# Patient Record
Sex: Male | Born: 1980 | Race: White | Hispanic: No | Marital: Single | Smoking: Current every day smoker
Health system: Southern US, Community
[De-identification: ages and names within clinical notes are randomized; demographics above are authoritative.]

## PROBLEM LIST (undated history)

## (undated) DIAGNOSIS — M549 Dorsalgia, unspecified: Secondary | ICD-10-CM

## (undated) DIAGNOSIS — G8929 Other chronic pain: Secondary | ICD-10-CM

## (undated) HISTORY — PX: OTHER SURGICAL HISTORY: SHX169

---

## 2008-08-03 ENCOUNTER — Emergency Department (HOSPITAL_COMMUNITY): Admission: EM | Admit: 2008-08-03 | Discharge: 2008-08-04 | Payer: Self-pay | Admitting: Emergency Medicine

## 2011-03-25 ENCOUNTER — Encounter: Payer: Self-pay | Admitting: *Deleted

## 2011-03-25 ENCOUNTER — Emergency Department (HOSPITAL_BASED_OUTPATIENT_CLINIC_OR_DEPARTMENT_OTHER)
Admission: EM | Admit: 2011-03-25 | Discharge: 2011-03-25 | Disposition: A | Discharge status: Home / Self Care | Payer: Self-pay | Attending: Emergency Medicine | Admitting: Emergency Medicine

## 2011-03-25 DIAGNOSIS — M549 Dorsalgia, unspecified: Secondary | ICD-10-CM | POA: Insufficient documentation

## 2011-03-25 DIAGNOSIS — F17200 Nicotine dependence, unspecified, uncomplicated: Secondary | ICD-10-CM | POA: Insufficient documentation

## 2011-03-25 HISTORY — DX: Other chronic pain: G89.29

## 2011-03-25 MED ORDER — TRAMADOL HCL 50 MG PO TABS: 50.0000 mg | ORAL_TABLET | Freq: Four times a day (QID) | ORAL | Status: AC | PRN
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
## 2011-03-25 NOTE — ED Provider Notes (Signed)
History/physical exam/procedure(s) were performed by non-physician practitioner and as supervising physician I was immediately available for consultation/collaboration. I have reviewed all notes and am in agreement with care and plan.  Hilario Quarry, MD 03/25/11 [phone]

## 2011-03-25 NOTE — ED Notes (Signed)
Back pain entire back. Chronic pain since July. Was taken off Percocet and Vicodin and given Rx Gabepentin. States he needs Percocet.
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
## 2012-01-21 ENCOUNTER — Other Ambulatory Visit: Payer: Self-pay | Admitting: Neurosurgery

## 2012-01-21 DIAGNOSIS — IMO0002 Reserved for concepts with insufficient information to code with codable children: Secondary | ICD-10-CM

## 2012-01-21 DIAGNOSIS — M545 Low back pain: Secondary | ICD-10-CM

## 2012-01-23 ENCOUNTER — Other Ambulatory Visit: Payer: Self-pay

## 2012-01-23 ENCOUNTER — Inpatient Hospital Stay
Admission: RE | Admit: 2012-01-23 | Discharge: 2012-01-23 | Payer: Self-pay | Source: Ambulatory Visit | Attending: Neurosurgery | Admitting: Neurosurgery

## 2012-01-26 ENCOUNTER — Other Ambulatory Visit: Payer: Self-pay | Admitting: Neurosurgery

## 2012-01-26 DIAGNOSIS — M545 Low back pain: Secondary | ICD-10-CM

## 2012-01-26 DIAGNOSIS — IMO0002 Reserved for concepts with insufficient information to code with codable children: Secondary | ICD-10-CM

## 2012-01-28 ENCOUNTER — Ambulatory Visit
Admission: RE | Admit: 2012-01-28 | Discharge: 2012-01-28 | Disposition: A | Discharge status: Home / Self Care | Payer: Self-pay | Source: Ambulatory Visit | Attending: Neurosurgery | Admitting: Neurosurgery

## 2012-01-28 VITALS — BP 111/62 | HR 78

## 2012-01-28 DIAGNOSIS — M545 Low back pain: Secondary | ICD-10-CM

## 2012-01-28 DIAGNOSIS — IMO0002 Reserved for concepts with insufficient information to code with codable children: Secondary | ICD-10-CM
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
## 2012-01-28 MED ORDER — IOHEXOL 180 MG/ML  SOLN
17.0000 mL | Freq: Once | INTRAMUSCULAR | Status: AC | PRN
  Administered 2012-01-28: 17 mL via INTRATHECAL

## 2012-01-28 MED ORDER — DIAZEPAM 5 MG PO TABS
10.0000 mg | ORAL_TABLET | Freq: Once | ORAL | Status: AC
  Administered 2012-01-28: 10 mg via ORAL

## 2012-01-28 NOTE — Progress Notes (Signed)
Resting quietly on stomach; states he tends to sleep like this at home.  Says he feels "a little weird," perhaps from the Valium, being off his Amitriptylline and the sensations the procedure produces.  Snack at bedside, though patient with eyes closed at the moment.  jkl

## 2012-01-28 NOTE — Progress Notes (Signed)
Pt states he has been off amitriptyline for the past 2 days.

## 2012-01-29 ENCOUNTER — Telehealth: Payer: Self-pay | Admitting: Radiology

## 2012-01-29 NOTE — Telephone Encounter (Signed)
Pt c/o post myelo headache, pt instructed to go back to bed, force fluids and call early tomorrow if headache persist.

## 2012-01-30 ENCOUNTER — Telehealth: Payer: Self-pay

## 2012-01-30 NOTE — Telephone Encounter (Signed)
Pt asked about blood patch but does not want at this time and will maintain bedrest and call Monday if no improvement. Will call Dr. Despina Arias office [id number] patch order in case we need it. D. DoughertyRN

## 2012-02-02 ENCOUNTER — Ambulatory Visit
Admission: RE | Admit: 2012-02-02 | Discharge: 2012-02-02 | Disposition: A | Discharge status: Home / Self Care | Payer: Self-pay | Source: Ambulatory Visit | Attending: Neurosurgery | Admitting: Neurosurgery

## 2012-02-02 ENCOUNTER — Other Ambulatory Visit: Payer: Self-pay | Admitting: Neurosurgery

## 2012-02-02 DIAGNOSIS — G971 Other reaction to spinal and lumbar puncture: Secondary | ICD-10-CM

## 2012-02-02 MED ORDER — IOHEXOL 180 MG/ML  SOLN
1.0000 mL | Freq: Once | INTRAMUSCULAR | Status: AC | PRN
  Administered 2012-02-02: 1 mL via EPIDURAL

## 2012-02-02 NOTE — Progress Notes (Signed)
1125 blood drawn for blood patch, 15 cc of blood drawn from left antecubital vein. Site unremarkable and pt tolerated fairly well, very nervous about any kind of needle stick.

## 2013-07-07 ENCOUNTER — Emergency Department (HOSPITAL_BASED_OUTPATIENT_CLINIC_OR_DEPARTMENT_OTHER)
Admission: EM | Admit: 2013-07-07 | Discharge: 2013-07-07 | Discharge status: Against Medical Advice | Payer: Medicaid Other | Attending: Emergency Medicine | Admitting: Emergency Medicine

## 2013-07-07 ENCOUNTER — Encounter (HOSPITAL_BASED_OUTPATIENT_CLINIC_OR_DEPARTMENT_OTHER): Payer: Self-pay | Admitting: Emergency Medicine

## 2013-07-07 DIAGNOSIS — F17200 Nicotine dependence, unspecified, uncomplicated: Secondary | ICD-10-CM | POA: Insufficient documentation

## 2013-07-07 DIAGNOSIS — R110 Nausea: Secondary | ICD-10-CM | POA: Insufficient documentation

## 2013-07-07 DIAGNOSIS — R269 Unspecified abnormalities of gait and mobility: Secondary | ICD-10-CM | POA: Insufficient documentation

## 2013-07-07 DIAGNOSIS — R42 Dizziness and giddiness: Secondary | ICD-10-CM | POA: Insufficient documentation

## 2013-07-07 DIAGNOSIS — R319 Hematuria, unspecified: Secondary | ICD-10-CM | POA: Insufficient documentation

## 2013-07-07 DIAGNOSIS — G8929 Other chronic pain: Secondary | ICD-10-CM | POA: Insufficient documentation

## 2013-07-07 NOTE — ED Notes (Addendum)
Hematuria since this am. Nausea and lightheaded. His gait is staggered and his speech is slurred. He states he took his normal pain medication that he normally takes.

## 2013-07-07 NOTE — ED Notes (Signed)
John EMT got pt a w/c due to unsteady gait. Pt refused to sit down. States he is leaving. Girl friend is in the waiting area and will drive him home.

## 2014-05-09 ENCOUNTER — Emergency Department (HOSPITAL_COMMUNITY)
Admission: EM | Admit: 2014-05-09 | Discharge: 2014-05-09 | Disposition: A | Discharge status: Home / Self Care | Payer: Medicaid Other | Attending: Emergency Medicine | Admitting: Emergency Medicine

## 2014-05-09 ENCOUNTER — Encounter (HOSPITAL_COMMUNITY): Payer: Self-pay | Admitting: Emergency Medicine

## 2014-05-09 DIAGNOSIS — Z79891 Long term (current) use of opiate analgesic: Secondary | ICD-10-CM | POA: Diagnosis not present

## 2014-05-09 DIAGNOSIS — S40869A Insect bite (nonvenomous) of unspecified upper arm, initial encounter: Secondary | ICD-10-CM | POA: Insufficient documentation

## 2014-05-09 DIAGNOSIS — Z720 Tobacco use: Secondary | ICD-10-CM | POA: Insufficient documentation

## 2014-05-09 DIAGNOSIS — Y9259 Other trade areas as the place of occurrence of the external cause: Secondary | ICD-10-CM | POA: Diagnosis not present

## 2014-05-09 DIAGNOSIS — Y998 Other external cause status: Secondary | ICD-10-CM | POA: Diagnosis not present

## 2014-05-09 DIAGNOSIS — S1086XA Insect bite of other specified part of neck, initial encounter: Secondary | ICD-10-CM | POA: Insufficient documentation

## 2014-05-09 DIAGNOSIS — G8929 Other chronic pain: Secondary | ICD-10-CM | POA: Diagnosis not present

## 2014-05-09 DIAGNOSIS — S60569A Insect bite (nonvenomous) of unspecified hand, initial encounter: Secondary | ICD-10-CM | POA: Diagnosis not present

## 2014-05-09 DIAGNOSIS — W57XXXA Bitten or stung by nonvenomous insect and other nonvenomous arthropods, initial encounter: Secondary | ICD-10-CM | POA: Diagnosis not present

## 2014-05-09 DIAGNOSIS — R21 Rash and other nonspecific skin eruption: Secondary | ICD-10-CM

## 2014-05-09 DIAGNOSIS — Z79899 Other long term (current) drug therapy: Secondary | ICD-10-CM | POA: Insufficient documentation

## 2014-05-09 DIAGNOSIS — Y9389 Activity, other specified: Secondary | ICD-10-CM | POA: Insufficient documentation

## 2014-05-09 DIAGNOSIS — S80869A Insect bite (nonvenomous), unspecified lower leg, initial encounter: Secondary | ICD-10-CM | POA: Diagnosis not present

## 2014-05-09 DIAGNOSIS — Z791 Long term (current) use of non-steroidal anti-inflammatories (NSAID): Secondary | ICD-10-CM | POA: Diagnosis not present

## 2014-05-09 DIAGNOSIS — S70369A Insect bite (nonvenomous), unspecified thigh, initial encounter: Secondary | ICD-10-CM | POA: Diagnosis not present

## 2014-05-09 DIAGNOSIS — S0086XA Insect bite (nonvenomous) of other part of head, initial encounter: Secondary | ICD-10-CM | POA: Diagnosis not present

## 2014-05-09 MED ORDER — CEPHALEXIN 250 MG PO CAPS: 250.0000 mg | ORAL_CAPSULE | Freq: Four times a day (QID) | ORAL | Status: DC

## 2014-05-09 MED ORDER — CEPHALEXIN 250 MG/5ML PO SUSR: 250.0000 mg | Freq: Four times a day (QID) | ORAL | Status: DC

## 2014-05-09 MED ORDER — HYDROXYZINE HCL 25 MG PO TABS: 25.0000 mg | ORAL_TABLET | Freq: Four times a day (QID) | ORAL | Status: DC | PRN

## 2014-05-09 MED ORDER — PERMETHRIN 5 % EX CREA: TOPICAL_CREAM | CUTANEOUS | Status: DC

## 2014-05-09 MED ORDER — PERMETHRIN 5 % EX CREA: TOPICAL_CREAM | CUTANEOUS | Status: AC

## 2014-05-09 NOTE — Discharge Instructions (Signed)

## 2014-05-09 NOTE — ED Notes (Signed)
Pt. reports multiple itchy rashes at arms ,legs and back of neck with drainage for several weeks , denies fever or chills.

## 2014-05-09 NOTE — ED Provider Notes (Signed)
CSN: [id number]     Arrival date & time 05/09/14  0419 History   First MD Initiated Contact with Patient 05/09/14 [phone]     Chief Complaint  Patient presents with  . Rash     (Consider location/radiation/quality/duration/timing/severity/associated sxs/prior Treatment) HPI Patient presents with itching rash for the past month. He states a lot of of hotel rooms is concern for possible bedbug bites. He also admits to scratching the lesions. He does develop erythema and discharge from several of the site to his scratching. He denies any fever or chills.  Past Medical History  Diagnosis Date  . Chronic pain    History reviewed. No pertinent past surgical history. No family history on file. History  Substance Use Topics  . Smoking status: Current Every Day Smoker -- 2.00 packs/day  . Smokeless tobacco: Not on file  . Alcohol Use: No    Review of Systems  Constitutional: Negative for fever and chills.  Skin: Positive for color change and rash.  All other systems reviewed and are negative.     Allergies  Gabapentin  Home Medications   Prior to Admission medications   Medication Sig Start Date End Date Taking? Authorizing Provider  Methocarbamol (ROBAXIN PO) Take 1 tablet by mouth 2 (two) times daily as needed (pain).    Yes Historical Provider, MD  naproxen sodium (ANAPROX) 220 MG tablet Take 220 mg by mouth 2 (two) times daily as needed (pain).   Yes Historical Provider, MD  oxyCODONE (ROXICODONE) 15 MG immediate release tablet Take 15 mg by mouth 4 (four) times daily.   Yes Historical Provider, MD  cephALEXin (KEFLEX) 250 MG capsule Take 1 capsule (250 mg total) by mouth 4 (four) times daily. 05/09/14   Loren Raceravid Domenique Southers, MD  GABAPENTIN PO Take by mouth.      Historical Provider, MD  hydrOXYzine (ATARAX/VISTARIL) 25 MG tablet Take 1 tablet (25 mg total) by mouth every 6 (six) hours as needed for itching. 05/09/14   Loren Raceravid Asencion Loveday, MD  Oxycodone-Acetaminophen (PERCOCET PO) Take by  mouth.    Historical Provider, MD  permethrin (ELIMITE) 5 % cream Apply to entire body once 05/09/14   Loren Raceravid Arella Blinder, MD   BP 121/92 mmHg  Pulse 86  Temp(Src) 97.4 F (36.3 C) (Oral)  Resp 19  Ht 5\' 9"  (1.753 m)  Wt 145 lb (65.772 kg)  BMI 21.40 kg/m2  SpO2 99% Physical Exam  Constitutional: He is oriented to person, place, and time. He appears well-developed and well-nourished. No distress.  HENT:  Head: Normocephalic and atraumatic.  Mouth/Throat: Oropharynx is clear and moist.  Eyes: EOM are normal. Pupils are equal, round, and reactive to light.  Neck: Normal range of motion. Neck supple.  Cardiovascular: Normal rate and regular rhythm.   Pulmonary/Chest: Effort normal and breath sounds normal. No respiratory distress. He has no wheezes. He has no rales.  Abdominal: Soft. Bowel sounds are normal. He exhibits no distension and no mass. There is no tenderness. There is no rebound and no guarding.  Musculoskeletal: Normal range of motion. He exhibits no edema or tenderness.  Neurological: He is alert and oriented to person, place, and time.  Skin: Skin is warm and dry. Rash (patient with multiple lesions of different stages of healing. These are mostly on the extremities and posterior head and neck. On the posterior neck these appear to be any linear arrangement. There is surrounding erythema and swelling. No definite fluctuan) noted. There is erythema.  Psychiatric: He has a normal mood  and affect. His behavior is normal.  Nursing note and vitals reviewed.   ED Course  Procedures (including critical care time) Labs Review Labs Reviewed - No data to display  Imaging Review No results found.   EKG Interpretation None      MDM   Final diagnoses:  Rash    Patient with multiple erythematous papules with possible secondary infection. Given the linear fashion of the lesions on the back of the neck suspect possible scabies. There is no inter-digital lesions. No definite  abscess. We'll treat with Elimite and Keflex. Return precautions have been given.    Loren Raceravid Ciaran Begay, MD 05/09/14 [phone]

## 2014-05-09 NOTE — ED Notes (Signed)
Dr. Yelverton at the bedside.  

## 2014-06-16 ENCOUNTER — Emergency Department (HOSPITAL_COMMUNITY)
Admission: EM | Admit: 2014-06-16 | Discharge: 2014-06-16 | Disposition: A | Discharge status: Home / Self Care | Payer: Medicaid Other | Attending: Emergency Medicine | Admitting: Emergency Medicine

## 2014-06-16 ENCOUNTER — Encounter (HOSPITAL_COMMUNITY): Payer: Self-pay | Admitting: *Deleted

## 2014-06-16 DIAGNOSIS — G8929 Other chronic pain: Secondary | ICD-10-CM | POA: Insufficient documentation

## 2014-06-16 DIAGNOSIS — Z79899 Other long term (current) drug therapy: Secondary | ICD-10-CM | POA: Insufficient documentation

## 2014-06-16 DIAGNOSIS — L989 Disorder of the skin and subcutaneous tissue, unspecified: Secondary | ICD-10-CM | POA: Diagnosis not present

## 2014-06-16 DIAGNOSIS — R21 Rash and other nonspecific skin eruption: Secondary | ICD-10-CM

## 2014-06-16 DIAGNOSIS — Z720 Tobacco use: Secondary | ICD-10-CM | POA: Insufficient documentation

## 2014-06-16 DIAGNOSIS — Z791 Long term (current) use of non-steroidal anti-inflammatories (NSAID): Secondary | ICD-10-CM | POA: Diagnosis not present

## 2014-06-16 HISTORY — DX: Dorsalgia, unspecified: M54.9

## 2014-06-16 MED ORDER — CEPHALEXIN 250 MG PO CAPS: 250.0000 mg | ORAL_CAPSULE | Freq: Four times a day (QID) | ORAL | Status: DC

## 2014-06-16 MED ORDER — HYDROXYZINE HCL 25 MG PO TABS: 25.0000 mg | ORAL_TABLET | Freq: Four times a day (QID) | ORAL | Status: DC | PRN

## 2014-06-16 MED ORDER — CEPHALEXIN 250 MG PO CAPS: 250.0000 mg | ORAL_CAPSULE | Freq: Four times a day (QID) | ORAL | Status: AC

## 2014-06-16 MED ORDER — HYDROXYZINE HCL 25 MG PO TABS: 25.0000 mg | ORAL_TABLET | Freq: Four times a day (QID) | ORAL | Status: AC | PRN

## 2014-06-16 NOTE — ED Notes (Signed)
Patient presents with rash on legs, face, neck  Has been treated several times for scabies but they continue to return

## 2014-06-16 NOTE — Discharge Instructions (Signed)
Bedbugs  Bedbugs are tiny bugs that live in and around beds. During the day, they hide in mattresses and other places near beds. They come out at night and bite people lying in bed. They need blood to live and grow. Bedbugs can be found in beds anywhere. Usually, they are found in places where many people come and go (hotels, shelters, hospitals). It does not matter whether the place is dirty or clean.  Getting bitten by bedbugs rarely causes a medical problem. The biggest problem can be getting rid of them.  This often takes the work of a pest control expert.  CAUSES  · Less use of pesticides. Bedbugs were common before the 1950s. Then, strong pesticides such as DDT nearly wiped them out. Today, these pesticides are not used because they harm the environment and can cause health problems.  · More travel. Besides mattresses, bedbugs can also live in clothing and luggage. They can come along as people travel from place to place. Bedbugs are more common in certain parts of the world. When people travel to those areas, the bugs can come home with them.  · Presence of birds and bats. Bedbugs often infest birds and bats. If you have these animals in or near your home, bedbugs may infest your house, too.  SYMPTOMS  It does not hurt to be bitten by a bedbug. You will probably not wake up when you are bitten. Bedbugs usually bite areas of the skin that are not covered. Symptoms may show when you wake up, or they may take a day or more to show up. Symptoms may include:  · Small red bumps on the skin. These might be lined up in a row or clustered in a group.  · A darker red dot in the middle of red bumps.  · Blisters on the skin. There may be swelling and very bad itching. These may be signs of an allergic reaction. This does not happen often.  DIAGNOSIS  Bedbug bites might look and feel like other types of insect bites. The bugs do not stay on the body like ticks or lice. They bite, drop off, and crawl away to hide. Your  caregiver will probably:  · Ask about your symptoms.  · Ask about your recent activities and travel.  · Check your skin for bedbug bites.  · Ask you to check at home for signs of bedbugs. You should look for:  ¨ Spots or stains on the bed or nearby. This could be from bedbugs that were crushed or from their eggs or waste.  ¨ Bedbugs themselves. They are reddish-brown, oval, and flat. They do not fly. They are about the size of an apple seed.  · Places to look for bedbugs include:  ¨ Beds. Check mattresses, headboards, box springs, and bed frames.  ¨ On drapes and curtains near the bed.  ¨ Under carpeting in the bedroom.  ¨ Behind electrical outlets.  ¨ Behind any wallpaper that is peeling.  ¨ Inside luggage.  TREATMENT  Most bedbug bites do not need treatment. They usually go away on their own in a few days. The bites are not dangerous. However, treatment may be needed if you have scratched so much that your skin has become infected. You may also need treatment if you are allergic to bedbug bites. Treatment options include:  · A drug that stops swelling and itching (corticosteroid). Usually, a cream is rubbed on the skin. If you have a bad rash, you may be   given a corticosteroid pill.  · Oral antihistamines. These are pills to help control itching.  · Antibiotic medicines. An antibiotic may be prescribed for infected skin.  HOME CARE INSTRUCTIONS   · Take any medicine prescribed by your caregiver for your bites. Follow the directions carefully.  · Consider wearing pajamas with long sleeves and pant legs.  · Your bedroom may need to be treated. A pest control expert should make sure the bedbugs are gone. You may need to throw away mattresses or luggage. Ask the pest control expert what you can do to keep the bedbugs from coming back. Common suggestions include:  ¨ Putting a plastic cover over your mattress.  ¨ Washing and drying your clothes and bedding in hot water and a hot dryer. The temperature should be hotter  than 120° F (48.9° C). Bedbugs are killed by high temperatures.  ¨ Vacuuming carefully all around your bed. Vacuum in all cracks and crevices where the bugs might hide. Do this often.  ¨ Carefully checking all used furniture, bedding, or clothes that you bring into your house.  ¨ Eliminating bird nests and bat roosts.  · If you get bedbug bites when traveling, check all your possessions carefully before bringing them into your house. If you find any bugs on clothes or in your luggage, consider throwing those items away.  SEEK MEDICAL CARE IF:  · You have red bug bites that keep coming back.  · You have red bug bites that itch badly.  · You have bug bites that cause a skin rash.  · You have scratch marks that are red and sore.  SEEK IMMEDIATE MEDICAL CARE IF:  You have a fever.  Document Released: 06/28/2010 Document Revised: 08/18/2011 Document Reviewed: 06/28/2010  [employer]® Patient Information ©2015 [employer]. This information is not intended to replace advice given to you by your health care provider. Make sure you discuss any questions you have with your health care provider.

## 2014-06-16 NOTE — ED Notes (Signed)
Discharge instructions and prescriptions given to patient.  Voiced understanding. Steady gait when ambulating

## 2014-06-16 NOTE — ED Provider Notes (Signed)
CSN: [id number]     Arrival date & time 06/16/14  0404 History   First MD Initiated Contact with Patient 06/16/14 [phone]     Chief Complaint  Patient presents with  . Rash     (Consider location/radiation/quality/duration/timing/severity/associated sxs/prior Treatment) HPI Patient presents with recurrent pruritic rash. Was seen in the emergency department one month ago for similar rash. Suspected scabies versus bed bugs with secondary infection due to scratching. Was given Keflex and Elimite cream. The patient states he uses the cream and antibiotic with improvement of symptoms. Also states he thoroughly cleaned all laundry. The rash recurred. He was seen at [facility] in the emergency department and given Elimite cream again. There was no effect this time with cream. He presents this evening with his son and girlfriend. He recently developed similar rashes. He's had no fever or chills. He has multiple lesions some which are erythematous. He has had purulent drainage from some of the sites. The rash is located on neck, leg, trunk and extremities. Past Medical History  Diagnosis Date  . Chronic pain   . Back pain    Past Surgical History  Procedure Laterality Date  . Kidney stones     No family history on file. History  Substance Use Topics  . Smoking status: Current Every Day Smoker -- 2.00 packs/day  . Smokeless tobacco: Never Used  . Alcohol Use: No    Review of Systems  Constitutional: Negative for fever and chills.  Skin: Positive for color change and rash.  All other systems reviewed and are negative.     Allergies  Gabapentin  Home Medications   Prior to Admission medications   Medication Sig Start Date End Date Taking? Authorizing Provider  Methocarbamol (ROBAXIN PO) Take 1 tablet by mouth 2 (two) times daily as needed (pain).    Yes Historical Provider, MD  naproxen sodium (ANAPROX) 220 MG tablet Take 220 mg by mouth 2 (two) times daily as needed (pain).    Yes Historical Provider, MD  oxyCODONE (ROXICODONE) 15 MG immediate release tablet Take 15 mg by mouth 4 (four) times daily.   Yes Historical Provider, MD  cephALEXin (KEFLEX) 250 MG capsule Take 1 capsule (250 mg total) by mouth 4 (four) times daily. 06/16/14   Loren Racer, MD  GABAPENTIN PO Take by mouth.      Historical Provider, MD  hydrOXYzine (ATARAX/VISTARIL) 25 MG tablet Take 1 tablet (25 mg total) by mouth every 6 (six) hours as needed for itching. 06/16/14   Loren Racer, MD  Oxycodone-Acetaminophen (PERCOCET PO) Take by mouth.    Historical Provider, MD  permethrin (ELIMITE) 5 % cream Apply to entire body once Patient not taking: Reported on 06/16/2014 05/09/14   Loren Racer, MD   BP 101/47 mmHg  Pulse 90  Temp(Src) 98.1 F (36.7 C) (Oral)  Ht  (1.753 m)  Wt 150 lb (68.04 kg)  BMI 22.14 kg/m2  SpO2 98% Physical Exam  Constitutional: He is oriented to person, place, and time. He appears well-developed and well-nourished. No distress.  HENT:  Head: Normocephalic and atraumatic.  No intraoral lesions  Eyes: EOM are normal. Pupils are equal, round, and reactive to light.  Neck: Normal range of motion. Neck supple.  Cardiovascular: Normal rate.   Pulmonary/Chest: Effort normal.  Abdominal: Soft. Bowel sounds are normal.  Musculoskeletal: Normal range of motion. He exhibits no edema or tenderness.  Neurological: He is alert and oriented to person, place, and time.  Skin: Skin is  warm and dry. No rash noted. There is erythema.  Patient with multiple lesions with punctate center and surrounding erythema. Many. To be excoriated. There is no fluctuance or masses. No interdigital rashes.  Psychiatric: He has a normal mood and affect. His behavior is normal.  Nursing note and vitals reviewed.   ED Course  Procedures (including critical care time) Labs Review Labs Reviewed - No data to display  Imaging Review No results found.   EKG Interpretation None      MDM    Final diagnoses:  Rash    Patient with recurrent rash and family members with similar symptoms. Patient also has been scratching the rash and likely has secondary infection. Has been treated with Elimite twice with little improvement. Does not have typical pattern for scabies. Suspect possible bed bugs. Suggest thorough cleaning of home and laundry and possible pest control specialist evaluation. We'll start back on antibiotic for suspected superinfection. Return precautions given.    Loren Raceravid Khan Chura, MD 06/16/14 [phone]

## 2015-09-03 ENCOUNTER — Ambulatory Visit: Payer: Medicaid Other | Attending: Family Medicine
